# Patient Record
Sex: Male | Born: 1940 | Race: Black or African American | Hispanic: No
Health system: Southern US, Community
[De-identification: ages and names within clinical notes are randomized; demographics above are authoritative.]

## PROBLEM LIST (undated history)

## (undated) DIAGNOSIS — L659 Nonscarring hair loss, unspecified: Secondary | ICD-10-CM

## (undated) DIAGNOSIS — J309 Allergic rhinitis, unspecified: Secondary | ICD-10-CM

## (undated) DIAGNOSIS — E039 Hypothyroidism, unspecified: Secondary | ICD-10-CM

## (undated) DIAGNOSIS — E785 Hyperlipidemia, unspecified: Secondary | ICD-10-CM

## (undated) DIAGNOSIS — I639 Cerebral infarction, unspecified: Secondary | ICD-10-CM

## (undated) DIAGNOSIS — E559 Vitamin D deficiency, unspecified: Secondary | ICD-10-CM

## (undated) DIAGNOSIS — N529 Male erectile dysfunction, unspecified: Secondary | ICD-10-CM

## (undated) DIAGNOSIS — R319 Hematuria, unspecified: Secondary | ICD-10-CM

## (undated) DIAGNOSIS — I10 Essential (primary) hypertension: Secondary | ICD-10-CM

## (undated) DIAGNOSIS — R5383 Other fatigue: Secondary | ICD-10-CM

---

## 2004-07-20 ENCOUNTER — Ambulatory Visit: Payer: Self-pay | Admitting: Family Medicine

## 2013-07-20 ENCOUNTER — Emergency Department: Payer: Self-pay | Admitting: Emergency Medicine

## 2015-03-20 ENCOUNTER — Encounter: Payer: Self-pay | Admitting: *Deleted

## 2015-03-23 ENCOUNTER — Encounter
Admission: RE | Disposition: A | Discharge status: Home / Self Care | Payer: Self-pay | Source: Ambulatory Visit | Attending: Gastroenterology

## 2015-03-23 ENCOUNTER — Ambulatory Visit: Payer: Medicare HMO | Admitting: Anesthesiology

## 2015-03-23 ENCOUNTER — Ambulatory Visit
Admission: RE | Admit: 2015-03-23 | Discharge: 2015-03-23 | Disposition: A | Discharge status: Home / Self Care | Payer: Medicare HMO | Source: Ambulatory Visit | Attending: Gastroenterology | Admitting: Gastroenterology

## 2015-03-23 ENCOUNTER — Encounter: Payer: Self-pay | Admitting: *Deleted

## 2015-03-23 DIAGNOSIS — K5730 Diverticulosis of large intestine without perforation or abscess without bleeding: Secondary | ICD-10-CM | POA: Diagnosis not present

## 2015-03-23 DIAGNOSIS — I10 Essential (primary) hypertension: Secondary | ICD-10-CM | POA: Diagnosis not present

## 2015-03-23 DIAGNOSIS — Z1211 Encounter for screening for malignant neoplasm of colon: Secondary | ICD-10-CM | POA: Insufficient documentation

## 2015-03-23 DIAGNOSIS — D120 Benign neoplasm of cecum: Secondary | ICD-10-CM | POA: Diagnosis not present

## 2015-03-23 DIAGNOSIS — D125 Benign neoplasm of sigmoid colon: Secondary | ICD-10-CM | POA: Diagnosis not present

## 2015-03-23 DIAGNOSIS — Z8673 Personal history of transient ischemic attack (TIA), and cerebral infarction without residual deficits: Secondary | ICD-10-CM | POA: Diagnosis not present

## 2015-03-23 DIAGNOSIS — E039 Hypothyroidism, unspecified: Secondary | ICD-10-CM | POA: Insufficient documentation

## 2015-03-23 DIAGNOSIS — D124 Benign neoplasm of descending colon: Secondary | ICD-10-CM | POA: Diagnosis not present

## 2015-03-23 DIAGNOSIS — D123 Benign neoplasm of transverse colon: Secondary | ICD-10-CM | POA: Insufficient documentation

## 2015-03-23 DIAGNOSIS — E785 Hyperlipidemia, unspecified: Secondary | ICD-10-CM | POA: Insufficient documentation

## 2015-03-23 DIAGNOSIS — Z7982 Long term (current) use of aspirin: Secondary | ICD-10-CM | POA: Insufficient documentation

## 2015-03-23 DIAGNOSIS — Z79899 Other long term (current) drug therapy: Secondary | ICD-10-CM | POA: Diagnosis not present

## 2015-03-23 HISTORY — DX: Allergic rhinitis, unspecified: J30.9

## 2015-03-23 HISTORY — DX: Hyperlipidemia, unspecified: E78.5

## 2015-03-23 HISTORY — PX: COLONOSCOPY WITH PROPOFOL: SHX5780

## 2015-03-23 HISTORY — DX: Vitamin D deficiency, unspecified: E55.9

## 2015-03-23 HISTORY — DX: Cerebral infarction, unspecified: I63.9

## 2015-03-23 HISTORY — DX: Hypothyroidism, unspecified: E03.9

## 2015-03-23 HISTORY — DX: Hematuria, unspecified: R31.9

## 2015-03-23 HISTORY — DX: Other fatigue: R53.83

## 2015-03-23 HISTORY — DX: Essential (primary) hypertension: I10

## 2015-03-23 HISTORY — DX: Male erectile dysfunction, unspecified: N52.9

## 2015-03-23 HISTORY — DX: Nonscarring hair loss, unspecified: L65.9

## 2015-03-23 SURGERY — COLONOSCOPY WITH PROPOFOL
Anesthesia: General

## 2015-03-23 MED ORDER — SODIUM CHLORIDE 0.9 % IV SOLN
INTRAVENOUS | Status: DC
Start: 2015-03-23 — End: 2015-03-23

## 2015-03-23 MED ORDER — MIDAZOLAM HCL 2 MG/2ML IJ SOLN
INTRAMUSCULAR | Status: DC | PRN
  Administered 2015-03-23: 1 mg via INTRAVENOUS

## 2015-03-23 MED ORDER — PROPOFOL INFUSION 10 MG/ML OPTIME
INTRAVENOUS | Status: DC | PRN
  Administered 2015-03-23: 100 ug/kg/min via INTRAVENOUS

## 2015-03-23 MED ORDER — SODIUM CHLORIDE 0.9 % IV SOLN
INTRAVENOUS | Status: DC
  Administered 2015-03-23: 09:00:00 via INTRAVENOUS

## 2015-03-23 MED ORDER — FENTANYL CITRATE (PF) 100 MCG/2ML IJ SOLN
INTRAMUSCULAR | Status: DC | PRN
  Administered 2015-03-23: 50 ug via INTRAVENOUS

## 2015-03-23 NOTE — Op Note (Signed)
[facility] Gastroenterology Patient Name: Luke French Procedure Date: 03/23/2015 9:12 AM MRN: [id number] Account #: [id number] Date of Birth: 1941-05-07 Admit Type: Outpatient Age: 74 Room: [facility] ENDO ROOM 3 Gender: Male Note Status: Finalized Procedure:         Colonoscopy Indications:       Screening for colorectal malignant neoplasm, This is the                     patient's first colonoscopy Patient Profile:   This is a 74 year old male. Providers:         Gerrit Heck. Rayann Heman, MD Referring MD:      Baltazar Apo, MD (Referring MD) Medicines:         Propofol per Anesthesia Complications:     No immediate complications. Procedure:         Pre-Anesthesia Assessment:                    - Prior to the procedure, a History and Physical was                     performed, and patient medications, allergies and                     sensitivities were reviewed. The patient's tolerance of                     previous anesthesia was reviewed.                    After obtaining informed consent, the colonoscope was                     passed under direct vision. Throughout the procedure, the                     patient's blood pressure, pulse, and oxygen saturations                     were monitored continuously. The Colonoscope was                     introduced through the anus and advanced to the the cecum,                     identified by appendiceal orifice and ileocecal valve. The                     colonoscopy was performed without difficulty. The patient                     tolerated the procedure well. The quality of the bowel                     preparation was excellent. Findings:      The perianal and digital rectal examinations were normal.      A 4 mm polyp was found in the cecum. The polyp was sessile. The polyp       was removed with a cold snare. Resection and retrieval were complete.      A 7 mm polyp was found in the descending colon. The polyp was       pedunculated. The polyp was removed with a hot snare. Resection and       retrieval were complete.  A 8 mm polyp was found in the sigmoid colon. The polyp was pedunculated.      A 5 mm polyp was found at the hepatic flexure. The polyp was       pedunculated. The polyp was removed with a hot snare. Resection and       retrieval were complete.      Two sessile polyps were found at the hepatic flexure. The polyps were 3       to 5 mm in size. These polyps were removed with a cold snare. Resection       and retrieval were complete.      A few small and large-mouthed diverticula were found in the sigmoid       colon, in the transverse colon and in the ascending colon.      The exam was otherwise without abnormality on direct and retroflexion       views. Impression:        - One 4 mm polyp in the cecum. Resected and retrieved.                    - One 7 mm polyp in the descending colon. Resected and                     retrieved.                    - One 8 mm polyp in the sigmoid colon.                    - One 5 mm polyp at the hepatic flexure. Resected and                     retrieved.                    - Two 3 to 5 mm polyps at the hepatic flexure. Resected                     and retrieved.                    - Diverticulosis in the sigmoid colon, in the transverse                     colon and in the ascending colon.                    - The examination was otherwise normal on direct and                     retroflexion views. Recommendation:    - Observe patient in GI recovery unit.                    - High fiber diet.                    - Continue present medications.                    - Await pathology results.                    - Repeat colonoscopy for surveillance based on pathology                     results, likely in 3 years.                    -  Return to referring physician.                    - The findings and recommendations were discussed with the                      patient.                    - The findings and recommendations were discussed with the                     patient's family. Procedure Code(s): --- Professional ---                    [phone], Colonoscopy, flexible; with removal of tumor(s),                     polyp(s), or other lesion(s) by snare technique CPT copyright 2014 [employer]. All rights reserved. The codes documented in this report are preliminary and upon coder review may  be revised to meet current compliance requirements. Mellody Life, MD 03/23/2015 10:04:06 AM This report has been signed electronically. Number of Addenda: 0 Note Initiated On: 03/23/2015 9:12 AM Scope Withdrawal Time: 0 hours 32 minutes 57 seconds  Total Procedure Duration: 0 hours 35 minutes 20 seconds       [facility]

## 2015-03-23 NOTE — Anesthesia Preprocedure Evaluation (Addendum)
Anesthesia Evaluation  Patient identified by MRN, date of birth, ID band Patient awake    Reviewed: Allergy & Precautions, NPO status , Patient's Chart, lab work & pertinent test results, reviewed documented beta blocker date and time   Airway Mallampati: II  TM Distance: >3 FB     Dental   Pulmonary          Cardiovascular hypertension, Pt. on medications     Neuro/Psych CVA    GI/Hepatic   Endo/Other  Hypothyroidism   Renal/GU      Musculoskeletal   Abdominal   Peds  Hematology   Anesthesia Other Findings BPH. Hx of stroke 20 yrs ago. OK now.  Reproductive/Obstetrics                           Anesthesia Physical Anesthesia Plan  ASA: II  Anesthesia Plan: General   Post-op Pain Management:    Induction: Intravenous  Airway Management Planned: Nasal Cannula  Additional Equipment:   Intra-op Plan:   Post-operative Plan:   Informed Consent: I have reviewed the patients History and Physical, chart, labs and discussed the procedure including the risks, benefits and alternatives for the proposed anesthesia with the patient or authorized representative who has indicated his/her understanding and acceptance.     Plan Discussed with: CRNA  Anesthesia Plan Comments:        Anesthesia Quick Evaluation

## 2015-03-23 NOTE — Anesthesia Postprocedure Evaluation (Signed)
  Anesthesia Post-op Note  Patient: Luke French  Procedure(s) Performed: Procedure(s): COLONOSCOPY WITH PROPOFOL (N/A)  Anesthesia type:General  Patient location: PACU  Post pain: Pain level controlled  Post assessment: Post-op Vital signs reviewed, Patient's Cardiovascular Status Stable, Respiratory Function Stable, Patent Airway and No signs of Nausea or vomiting  Post vital signs: Reviewed and stable  Last Vitals:  Filed Vitals:   03/23/15 1040  BP: 134/67  Pulse: 49  Temp:   Resp: 15    Level of consciousness: awake, alert  and patient cooperative  Complications: No apparent anesthesia complications

## 2015-03-23 NOTE — H&P (Signed)
  Primary Care Physician:  Denton Lank, MD  Pre-Procedure History & Physical: HPI:  Luke French is a 74 y.o. male is here for an colonoscopy.   Past Medical History  Diagnosis Date  . Hematuria   . Vitamin D deficiency   . Fatigue   . Hair loss   . Allergic rhinitis   . Hyperlipidemia   . Erectile dysfunction   . Hypothyroidism   . Hypertension   . CVA (cerebral infarction)     History reviewed. No pertinent past surgical history.  Prior to Admission medications   Medication Sig Start Date End Date Taking? Authorizing Provider  amLODipine (NORVASC) 10 MG tablet Take 10 mg by mouth daily.   Yes Historical Provider, MD  aspirin 325 MG tablet Take 325 mg by mouth daily.   Yes Historical Provider, MD  b complex vitamins tablet Take 1 tablet by mouth daily.   Yes Historical Provider, MD  levothyroxine (SYNTHROID, LEVOTHROID) 137 MCG tablet Take 137 mcg by mouth daily before breakfast.   Yes Historical Provider, MD  lovastatin (MEVACOR) 40 MG tablet Take 80 mg by mouth every evening.   Yes Historical Provider, MD  quinapril (ACCUPRIL) 20 MG tablet Take 60 mg by mouth daily.   Yes Historical Provider, MD  tamsulosin (FLOMAX) 0.4 MG CAPS capsule Take 0.4 mg by mouth daily.   Yes Historical Provider, MD    Allergies as of 03/11/2015  . (Not on File)    History reviewed. No pertinent family history.  History   Social History  . Marital Status: Divorced    Spouse Name: N/A  . Number of Children: N/A  . Years of Education: N/A   Occupational History  . Not on file.   Social History Main Topics  . Smoking status: Not on file  . Smokeless tobacco: Not on file  . Alcohol Use: Not on file  . Drug Use: Not on file  . Sexual Activity: Not on file   Other Topics Concern  . Not on file   Social History Narrative     Physical Exam: BP 155/82 mmHg  Pulse 68  Temp(Src) 97.6 F (36.4 C)  Ht 5\' 11"  (1.803 m)  Wt 168 lb (76.204 kg)  BMI 23.44 kg/m2  SpO2  99% General:   Alert,  pleasant and cooperative in NAD Head:  Normocephalic and atraumatic. Neck:  Supple; no masses or thyromegaly. Lungs:  Clear throughout to auscultation.    Heart:  Regular rate and rhythm. Abdomen:  Soft, nontender and nondistended. Normal bowel sounds, without guarding, and without rebound.   Neurologic:  Alert and  oriented x4;  grossly normal neurologically.  Impression/Plan: Luke French is here for an colonoscopy to be performed for screening  Risks, benefits, limitations, and alternatives regarding  colonoscopy have been reviewed with the patient.  Questions have been answered.  All parties agreeable.   Josefine Class, MD  03/23/2015, 9:10 AM

## 2015-03-23 NOTE — Transfer of Care (Signed)
Immediate Anesthesia Transfer of Care Note  Patient: Luke French  Procedure(s) Performed: Procedure(s): COLONOSCOPY WITH PROPOFOL (N/A)  Patient Location: PACU  Anesthesia Type:General  Level of Consciousness: awake and sedated  Airway & Oxygen Therapy: Patient Spontanous Breathing and Patient connected to nasal cannula oxygen  Post-op Assessment: Report given to RN and Post -op Vital signs reviewed and stable  Post vital signs: Reviewed and stable  Last Vitals:  Filed Vitals:   03/23/15 1006  BP: 92/57  Pulse: 54  Temp: 36 C  Resp: 19    Complications: No apparent anesthesia complications

## 2015-03-23 NOTE — Anesthesia Procedure Notes (Signed)
Date/Time: 03/23/2015 9:20 AM Performed by: Christy Sartorius Pre-anesthesia Checklist: Patient identified, Emergency Drugs available, Suction available, Patient being monitored and Timeout performed Patient Re-evaluated:Patient Re-evaluated prior to inductionOxygen Delivery Method: Nasal cannula Preoxygenation: Pre-oxygenation with 100% oxygen Intubation Type: IV induction

## 2015-03-23 NOTE — Discharge Instructions (Signed)

## 2015-03-24 LAB — SURGICAL PATHOLOGY

## 2015-03-25 ENCOUNTER — Encounter: Payer: Self-pay | Admitting: Gastroenterology

## 2016-01-29 ENCOUNTER — Other Ambulatory Visit: Payer: Self-pay | Admitting: Family Medicine

## 2016-01-29 DIAGNOSIS — G459 Transient cerebral ischemic attack, unspecified: Secondary | ICD-10-CM

## 2016-02-17 ENCOUNTER — Ambulatory Visit: Payer: Medicare HMO

## 2016-02-19 ENCOUNTER — Ambulatory Visit: Payer: Medicare HMO

## 2016-03-08 ENCOUNTER — Ambulatory Visit
Admission: RE | Admit: 2016-03-08 | Discharge: 2016-03-08 | Disposition: A | Discharge status: Home / Self Care | Payer: Medicare HMO | Source: Ambulatory Visit | Attending: Family Medicine | Admitting: Family Medicine

## 2016-03-08 DIAGNOSIS — G459 Transient cerebral ischemic attack, unspecified: Secondary | ICD-10-CM | POA: Insufficient documentation

## 2016-03-08 DIAGNOSIS — M2548 Effusion, other site: Secondary | ICD-10-CM | POA: Insufficient documentation

## 2017-08-27 IMAGING — US US CAROTID DUPLEX BILAT
1 series · 13 of 24 positions shown · non-contrast
Comparison: Brain MRI 03/08/2016 ; report from a prior remote
duplex ultrasound dated 07/26/1999

CLINICAL DATA: 74-year-old male with transient cerebral ischemia

EXAM:
BILATERAL CAROTID DUPLEX ULTRASOUND
TECHNIQUE: Gray scale imaging, color Doppler and duplex ultrasound were
performed of bilateral carotid and vertebral arteries in the neck.

[Series 1: us carotid duplex bilat · 0.06mm/px · 13 of 68 slices shown]
[im 1/68]
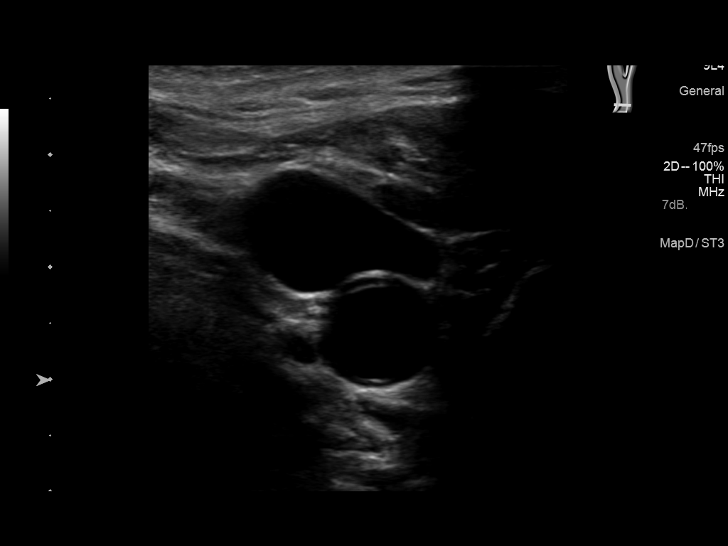
[im 6/68]
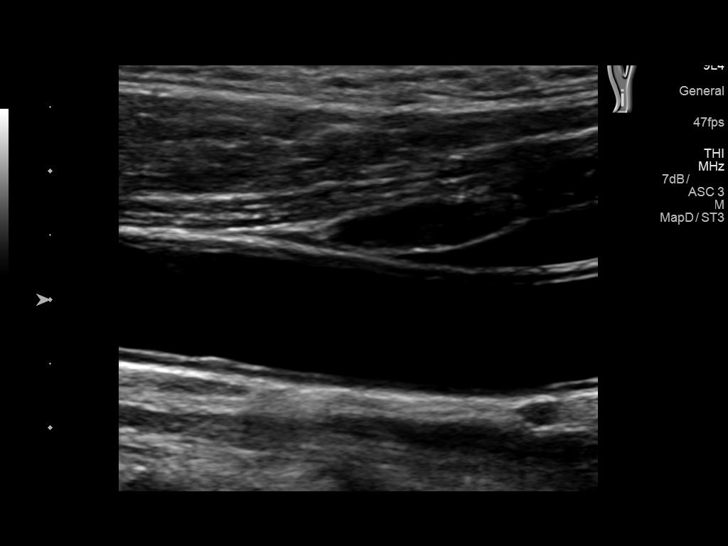
[im 12/68]
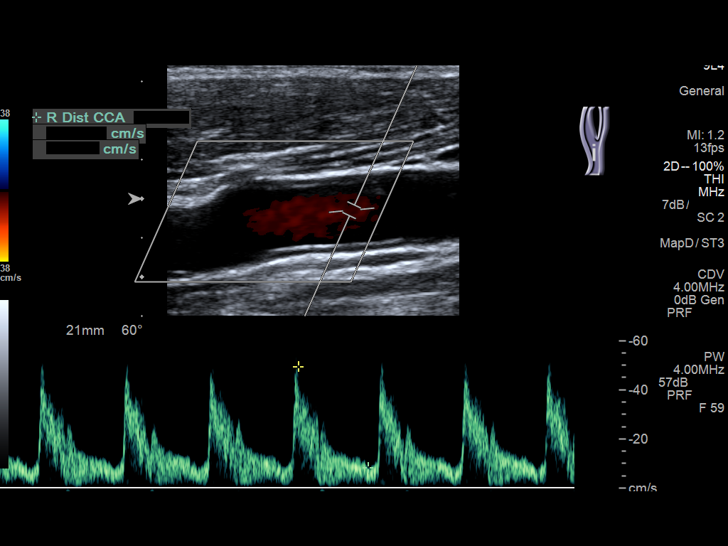
[im 18/68]
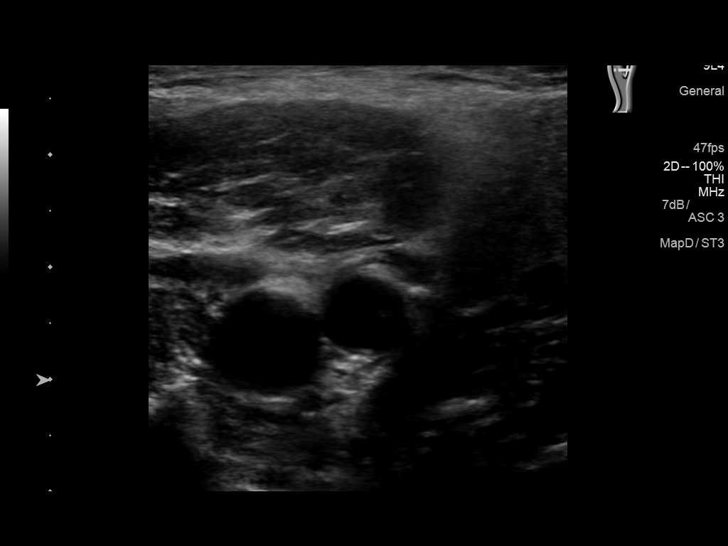
[im 24/68]
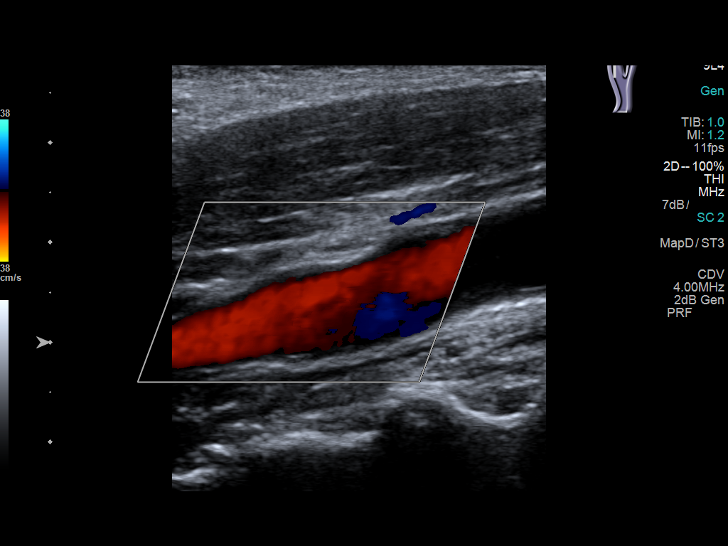
[im 30/68]
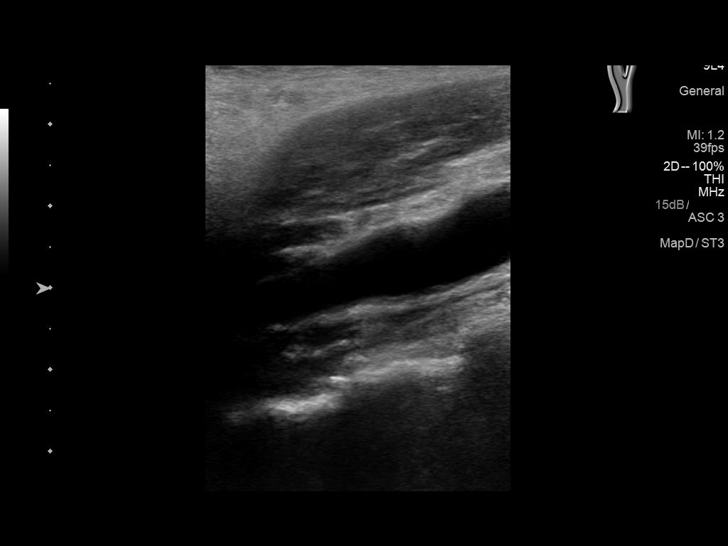
[im 35/68]
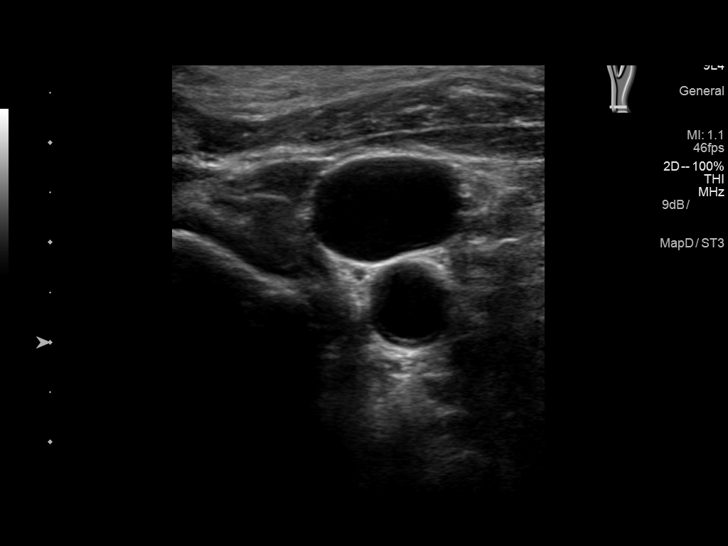
[im 38/68]
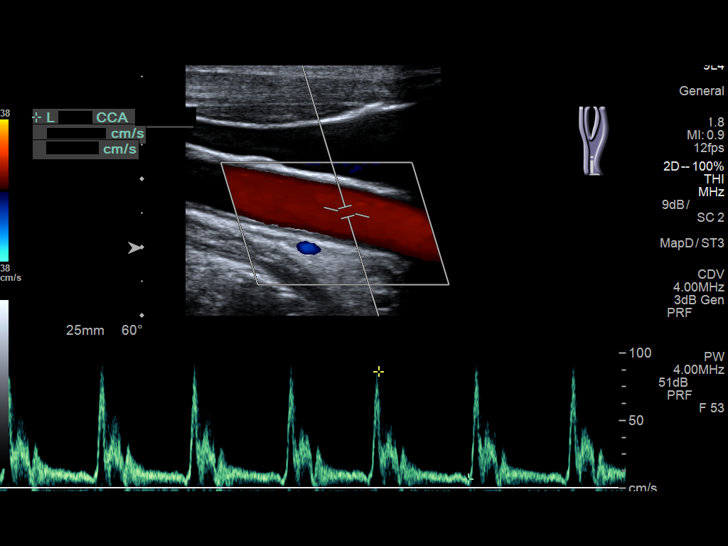
[im 44/68]
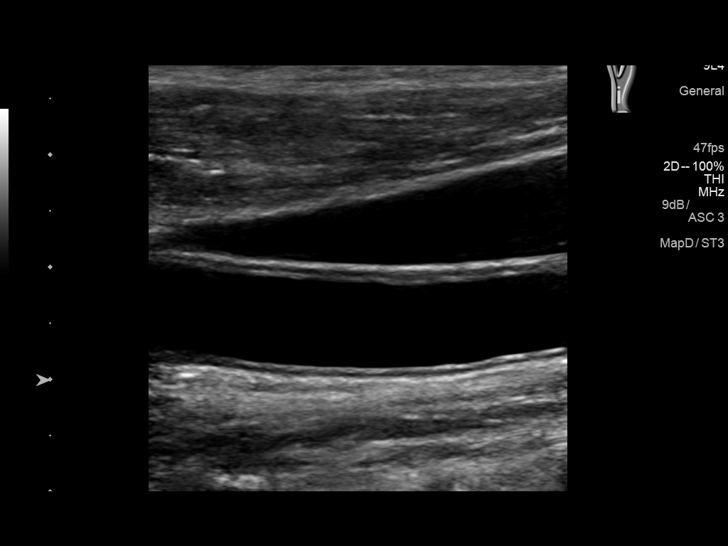
[im 50/68]
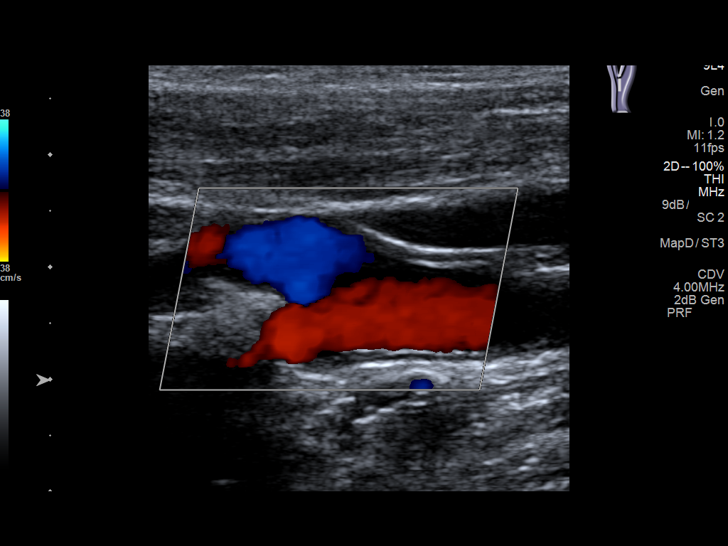
[im 56/68]
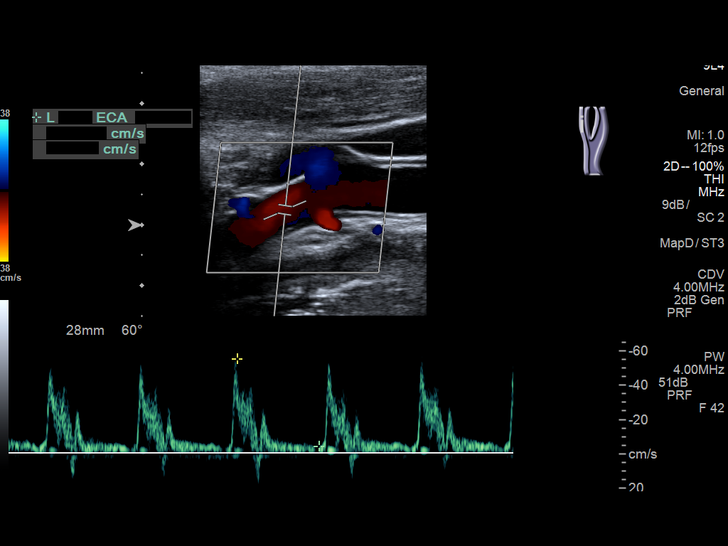
[im 62/68]
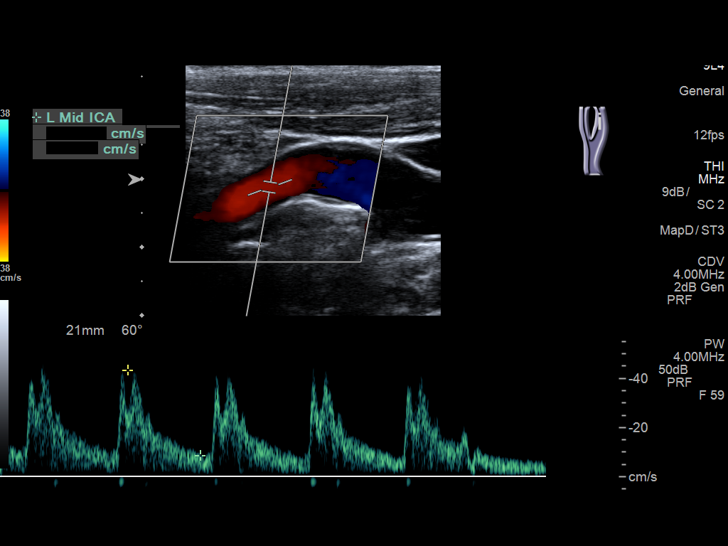
[im 68/68]
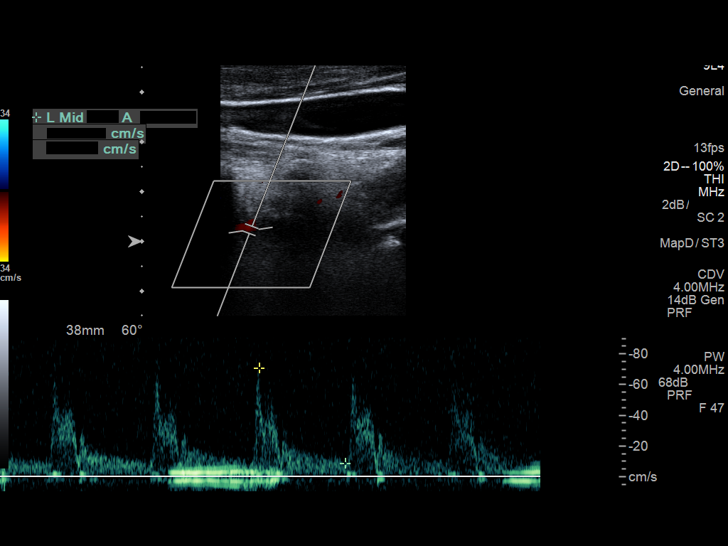

[13 of 24 positions shown; findings below may reference images not displayed]

FINDINGS: Criteria: Quantification of carotid stenosis is based on velocity
parameters that correlate the residual internal carotid diameter
with NASCET-based stenosis levels, using the diameter of the distal
internal carotid lumen as the denominator for stenosis measurement.

The following velocity measurements were obtained:

RIGHT

ICA:  45/11 cm/sec

CCA:  59/7 cm/sec

SYSTOLIC ICA/CCA RATIO:

DIASTOLIC ICA/CCA RATIO:

ECA:  58 cm/sec

LEFT

ICA:  60/15 cm/sec

CCA:  75/8 cm/sec

SYSTOLIC ICA/CCA RATIO:

DIASTOLIC ICA/CCA RATIO:

ECA:  55 cm/sec

RIGHT CAROTID ARTERY: No significant atherosclerotic plaque or
evidence of stenosis.

RIGHT VERTEBRAL ARTERY:  Patent with normal antegrade flow.

LEFT CAROTID ARTERY: No significant atherosclerotic plaque or
evidence of stenosis.

LEFT VERTEBRAL ARTERY:  Patent with normal antegrade flow.
IMPRESSION: Negative bilateral carotid duplex ultrasound.

## 2017-11-10 ENCOUNTER — Other Ambulatory Visit: Payer: Self-pay | Admitting: Primary Care

## 2017-11-10 DIAGNOSIS — R222 Localized swelling, mass and lump, trunk: Secondary | ICD-10-CM

## 2017-11-17 ENCOUNTER — Ambulatory Visit
Admission: RE | Admit: 2017-11-17 | Discharge: 2017-11-17 | Disposition: A | Discharge status: Home / Self Care | Payer: Medicare HMO | Source: Ambulatory Visit | Attending: Primary Care | Admitting: Primary Care

## 2017-11-17 DIAGNOSIS — R222 Localized swelling, mass and lump, trunk: Secondary | ICD-10-CM

## 2017-11-17 DIAGNOSIS — R928 Other abnormal and inconclusive findings on diagnostic imaging of breast: Secondary | ICD-10-CM | POA: Diagnosis not present

## 2018-02-08 ENCOUNTER — Other Ambulatory Visit: Payer: Self-pay | Admitting: Family Medicine

## 2018-02-08 DIAGNOSIS — N641 Fat necrosis of breast: Secondary | ICD-10-CM

## 2018-02-20 ENCOUNTER — Other Ambulatory Visit: Payer: Medicare HMO

## 2019-05-08 IMAGING — US US BREAST*R* LIMITED INC AXILLA
1 series · 7 of 7 positions shown · non-contrast
Comparison: None

ACR Breast Density Category a: The breast tissue is almost entirely
fatty.

CLINICAL DATA: 76-year-old man palpates a lump in the superior
right chest wall in the 12 o'clock region. He states that his skin
has been discolored/darker in this region. He does not know of any
direct trauma to the chest recently.

EXAM:
DIGITAL DIAGNOSTIC RIGHT MAMMOGRAM WITH CAD AND TOMO
ULTRASOUND RIGHT BREAST

[Series 1: us breast*right* limited inc axilla · 0.05mm/px · 7 of 7 slices shown]
[im 1/7]
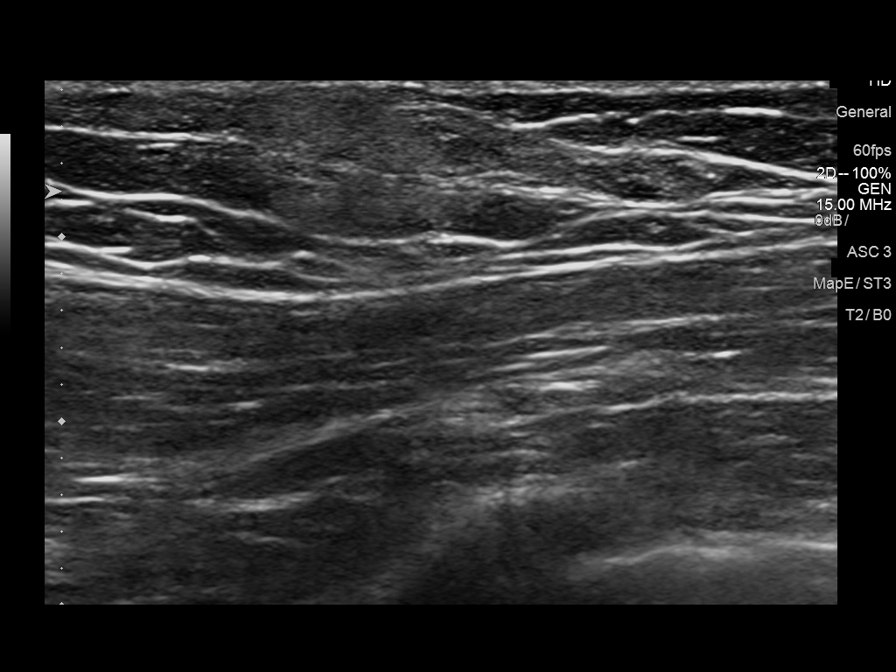
[im 2/7]
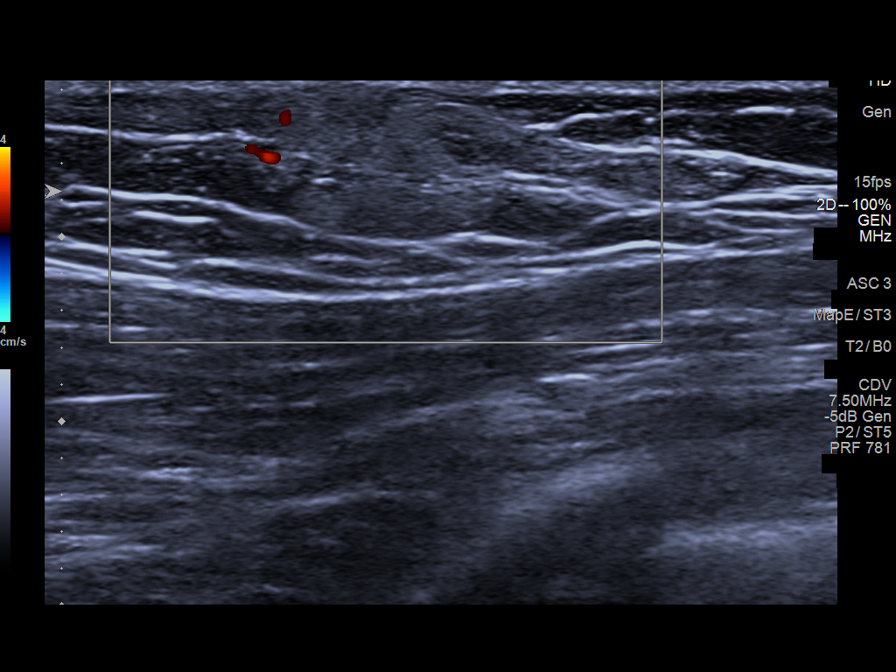
[im 3/7]
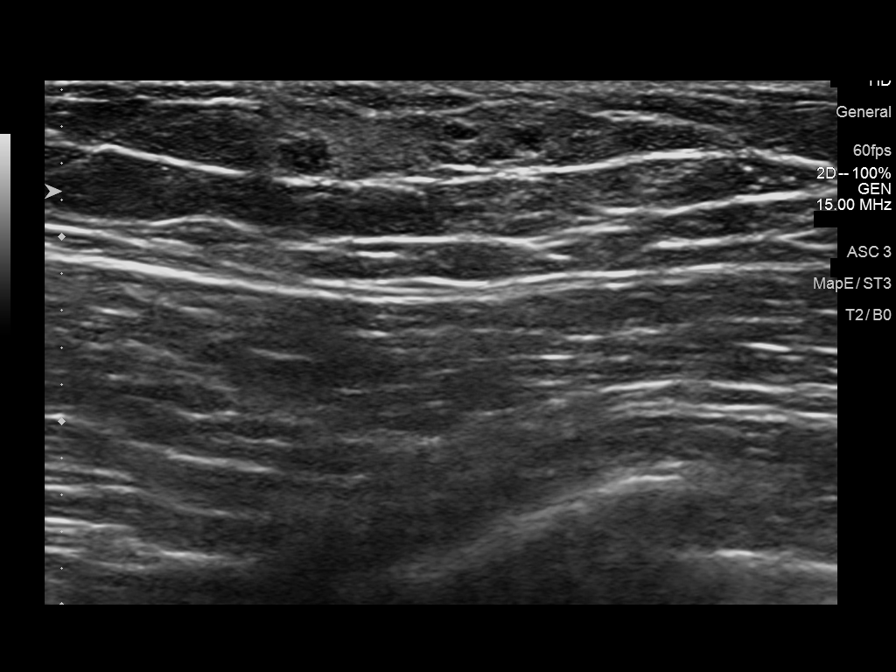
[im 4/7]
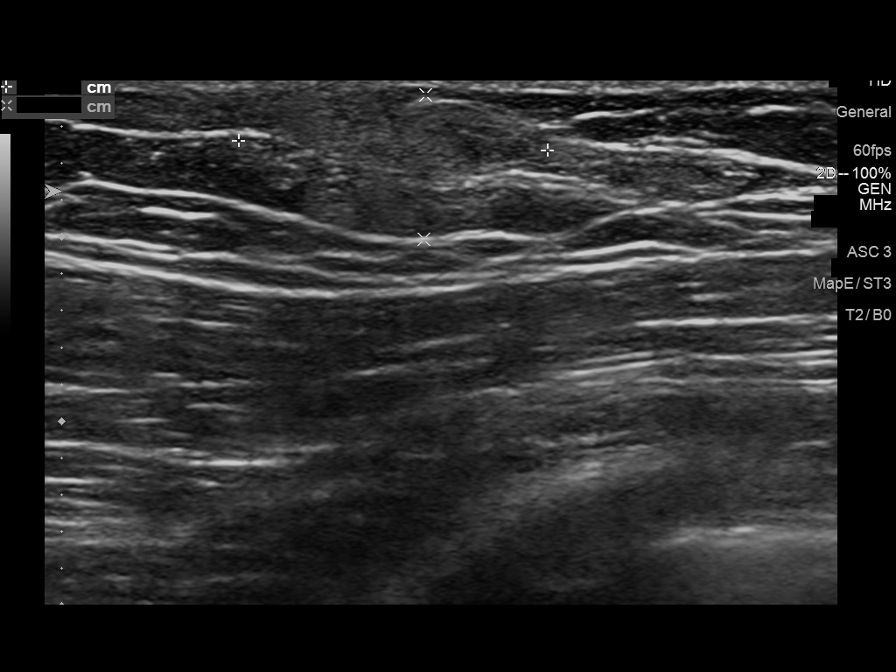
[im 5/7]
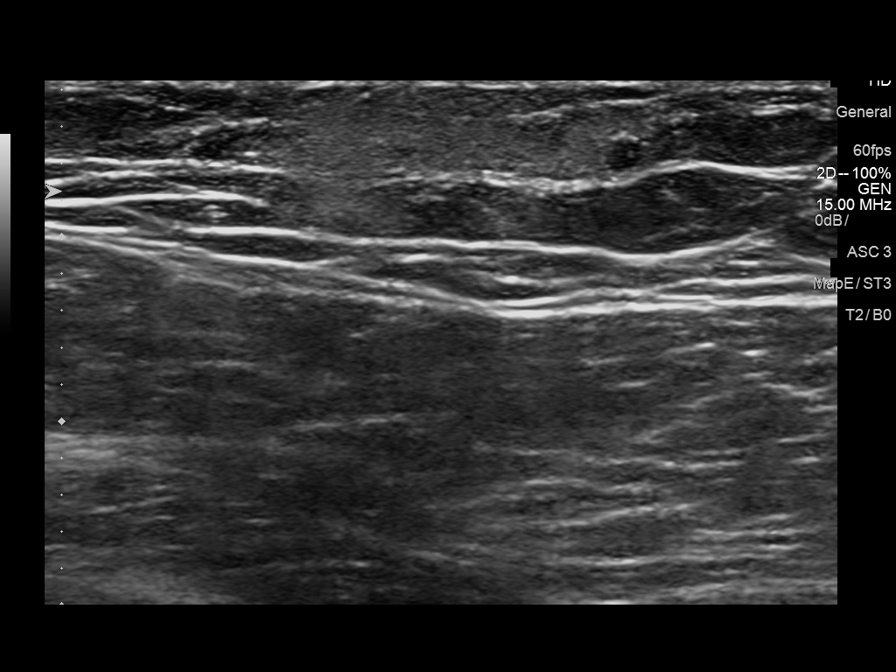
[im 6/7]
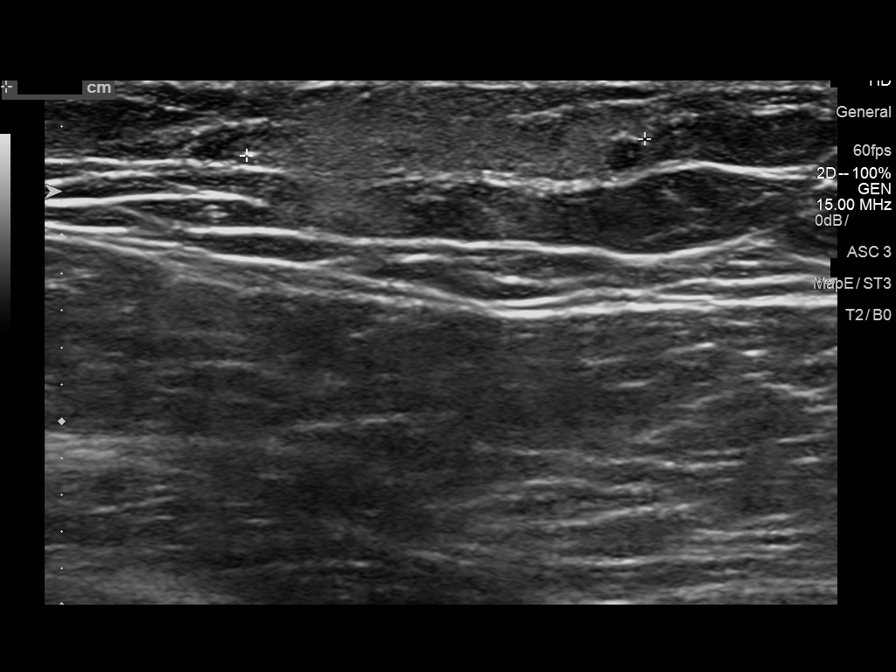
[im 7/7]
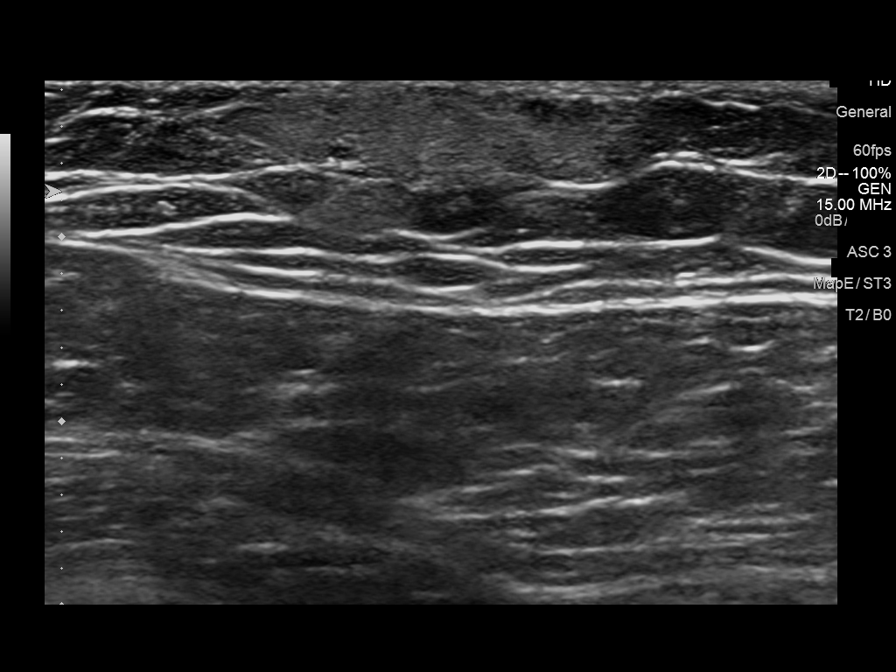

[7 of 7 positions shown; findings below may reference images not displayed]

FINDINGS: Metallic skin marker was placed in the region of patient concern on
the right in the superior right. Negative for gynecomastia
bilaterally. A spot tangential view of the region of patient concern
shows slight density in the subcutaneous fat immediately deep to the
metallic skin marker. No suspicious mass is seen in the right
breast. No mass is identified in the left breast. No findings to
suggest malignancy on the left.

Mammographic images were processed with CAD.

On physical exam, there is a faint green discoloration of the skin
of the right breast at 12 o'clock position 7 to 8 cm from the nipple
suggestive of a resolving bruise. I do palpate an approximately 2 cm
lump in this region. The lump is fairly soft.

Targeted ultrasound is performed, showing a hyperechoic area with
some internal cystic spaces in the subcutaneous fat of the right
chest at 12 o'clock position 7 cm from the nipple. This area of
hyperechogenicity and mixed cystic spaces measures approximately
x 0.8 x 2.2 cm.
IMPRESSION: Probable benign fat necrosis/bruising at the site of the palpable
area of concern in the superior right breast/chest.

RECOMMENDATION:
A 3 month follow-up right breast ultrasound is suggested.

I have discussed the findings and recommendations with the patient.
Results were also provided in writing at the conclusion of the
visit. If applicable, a reminder letter will be sent to the patient
regarding the next appointment.

BI-RADS CATEGORY  3: Probably benign.

## 2019-11-25 DEATH — deceased
